# Patient Record
Sex: Female | Born: 1960 | Race: White | Hispanic: No | Marital: Single | Smoking: Never smoker
Health system: Southern US, Community
[De-identification: ages and names within clinical notes are randomized; demographics above are authoritative.]

## PROBLEM LIST (undated history)

## (undated) DIAGNOSIS — H469 Unspecified optic neuritis: Secondary | ICD-10-CM

---

## 2015-06-06 ENCOUNTER — Encounter (HOSPITAL_BASED_OUTPATIENT_CLINIC_OR_DEPARTMENT_OTHER): Payer: Self-pay | Admitting: Emergency Medicine

## 2015-06-06 ENCOUNTER — Emergency Department (HOSPITAL_BASED_OUTPATIENT_CLINIC_OR_DEPARTMENT_OTHER): Payer: BLUE CROSS/BLUE SHIELD

## 2015-06-06 ENCOUNTER — Emergency Department (HOSPITAL_BASED_OUTPATIENT_CLINIC_OR_DEPARTMENT_OTHER)
Admission: EM | Admit: 2015-06-06 | Discharge: 2015-06-06 | Disposition: A | Discharge status: Home / Self Care | Payer: BLUE CROSS/BLUE SHIELD | Attending: Emergency Medicine | Admitting: Emergency Medicine

## 2015-06-06 DIAGNOSIS — Y9289 Other specified places as the place of occurrence of the external cause: Secondary | ICD-10-CM | POA: Insufficient documentation

## 2015-06-06 DIAGNOSIS — W450XXA Nail entering through skin, initial encounter: Secondary | ICD-10-CM | POA: Diagnosis not present

## 2015-06-06 DIAGNOSIS — S99922A Unspecified injury of left foot, initial encounter: Secondary | ICD-10-CM | POA: Diagnosis present

## 2015-06-06 DIAGNOSIS — Y9389 Activity, other specified: Secondary | ICD-10-CM | POA: Insufficient documentation

## 2015-06-06 DIAGNOSIS — Z23 Encounter for immunization: Secondary | ICD-10-CM | POA: Insufficient documentation

## 2015-06-06 DIAGNOSIS — Y998 Other external cause status: Secondary | ICD-10-CM | POA: Insufficient documentation

## 2015-06-06 DIAGNOSIS — S91332A Puncture wound without foreign body, left foot, initial encounter: Secondary | ICD-10-CM

## 2015-06-06 HISTORY — DX: Unspecified optic neuritis: H46.9

## 2015-06-06 MED ORDER — CIPROFLOXACIN HCL 500 MG PO TABS: 500.0000 mg | ORAL_TABLET | Freq: Two times a day (BID) | ORAL | Status: AC

## 2015-06-06 MED ORDER — TETANUS-DIPHTH-ACELL PERTUSSIS 5-2.5-18.5 LF-MCG/0.5 IM SUSP
0.5000 mL | Freq: Once | INTRAMUSCULAR | Status: AC
  Administered 2015-06-06: 0.5 mL via INTRAMUSCULAR
  Filled 2015-06-06: qty 0.5

## 2015-06-06 MED ORDER — HYDROCODONE-ACETAMINOPHEN 5-325 MG PO TABS: 1.0000 | ORAL_TABLET | Freq: Four times a day (QID) | ORAL | Status: AC | PRN

## 2015-06-06 MED ORDER — IBUPROFEN 800 MG PO TABS
800.0000 mg | ORAL_TABLET | Freq: Once | ORAL | Status: AC
  Administered 2015-06-06: 800 mg via ORAL
  Filled 2015-06-06: qty 1

## 2015-06-06 MED ORDER — CIPROFLOXACIN HCL 500 MG PO TABS
500.0000 mg | ORAL_TABLET | Freq: Once | ORAL | Status: AC
  Administered 2015-06-06: 500 mg via ORAL
  Filled 2015-06-06: qty 1

## 2015-06-06 NOTE — ED Notes (Signed)
Pt stepped on nail with right foot and nail went threw her flip flop into her foot

## 2015-06-06 NOTE — Discharge Instructions (Signed)
Puncture Wound °A puncture wound is an injury that extends through all layers of the skin and into the tissue beneath the skin (subcutaneous tissue). Puncture wounds become infected easily because germs often enter the body and go beneath the skin during the injury. Having a deep wound with a small entrance point makes it difficult for your caregiver to adequately clean the wound. This is especially true if you have stepped on a nail and it has passed through a dirty shoe or other situations where the wound is obviously contaminated. °CAUSES  °Many puncture wounds involve glass, nails, splinters, fish hooks, or other objects that enter the skin (foreign bodies). A puncture wound may also be caused by a human bite or animal bite. °DIAGNOSIS  °A puncture wound is usually diagnosed by your history and a physical exam. You may need to have an X-ray or an ultrasound to check for any foreign bodies still in the wound. °TREATMENT  °· Your caregiver will clean the wound as thoroughly as possible. Depending on the location of the wound, a bandage (dressing) may be applied. °· Your caregiver might prescribe antibiotic medicines. °· You may need a follow-up visit to check on your wound. Follow all instructions as directed by your caregiver. °HOME CARE INSTRUCTIONS  °· Change your dressing once per day, or as directed by your caregiver. If the dressing sticks, it may be removed by soaking the area in water. °· If your caregiver has given you follow-up instructions, it is very important that you return for a follow-up appointment. Not following up as directed could result in a chronic or permanent injury, pain, and disability. °· Only take over-the-counter or prescription medicines for pain, discomfort, or fever as directed by your caregiver. °· If you are given antibiotics, take them as directed. Finish them even if you start to feel better. °You may need a tetanus shot if: °· You cannot remember when you had your last tetanus  shot. °· You have never had a tetanus shot. °If you got a tetanus shot, your arm may swell, get red, and feel warm to the touch. This is common and not a problem. If you need a tetanus shot and you choose not to have one, there is a rare chance of getting tetanus. Sickness from tetanus can be serious. °You may need a rabies shot if an animal bite caused your puncture wound. °SEEK MEDICAL CARE IF:  °· You have redness, swelling, or increasing pain in the wound. °· You have red streaks going away from the wound. °· You notice a bad smell coming from the wound or dressing. °· You have yellowish-white fluid (pus) coming from the wound. °· You are treated with an antibiotic for infection, but the infection is not getting better. °· You notice something in the wound, such as rubber from your shoe, cloth, or another object. °· You have a fever. °· You have severe pain. °· You have difficulty breathing. °· You feel dizzy or faint. °· You cannot stop vomiting. °· You lose feeling, develop numbness, or cannot move a limb below the wound. °· Your symptoms worsen. °MAKE SURE YOU: °· Understand these instructions. °· Will watch your condition. °· Will get help right away if you are not doing well or get worse. °Document Released: 06/14/2005 Document Revised: 11/27/2011 Document Reviewed: 02/21/2011 °[employer]® Patient Information ©2015 [employer]. This information is not intended to replace advice given to you by your health care provider. Make sure you discuss any questions you   have with your health care provider. ° °

## 2015-06-06 NOTE — ED Provider Notes (Signed)
CSN: [id number]     Arrival date & time 06/06/15  1849 History  This chart was scribed for Elwin Mocha, MD by Octavia Heir, ED Scribe. This patient was seen in room MH01/MH01 and the patient's care was started at 7:02 PM.    Chief Complaint  Patient presents with  . Laceration     Patient is a 54 y.o. female presenting with foot injury. The history is provided by the patient. No language interpreter was used.  Foot Injury Location:  Foot Time since incident:  1 hour Injury: yes   Mechanism of injury comment:  Stepped on a nail Foot location:  Sole of L foot Pain details:    Quality:  Aching   Radiates to:  Does not radiate   Severity:  Moderate   Onset quality:  Gradual   Duration:  1 hour   Timing:  Constant   Progression:  Unchanged Chronicity:  New Dislocation: no   Foreign body present:  No foreign bodies Tetanus status:  Out of date Prior injury to area:  No Relieved by:  Nothing Worsened by:  Nothing tried Associated symptoms: no fever    HPI Comments: Martha Maldonado is a 54 y.o. female who presents to the Emergency Department complaining of a sudden onset left foot injury onset PTA. Pt notes she stepped on a nail that went through her flip flop this afternoon. Pt is unsure of her last tetanus shot.  No past medical history on file. No past surgical history on file. No family history on file. Social History  Substance Use Topics  . Smoking status: Not on file  . Smokeless tobacco: Not on file  . Alcohol Use: Not on file   OB History    No data available     Review of Systems  Constitutional: Negative for fever.  Respiratory: Negative for cough and shortness of breath.   Gastrointestinal: Negative for vomiting and abdominal pain.  All other systems reviewed and are negative.     Allergies  Review of patient's allergies indicates not on file.  Home Medications   Prior to Admission medications   Not on File   Triage vitals: BP 131/86 mmHg  Pulse  86  Temp(Src) 98.1 F (36.7 C) (Oral)  Resp 18  Ht  (1.6 m)  Wt 170 lb (77.111 kg)  BMI 30.12 kg/m2  SpO2 100% Physical Exam  Constitutional: She is oriented to person, place, and time. She appears well-developed and well-nourished. No distress.  HENT:  Head: Normocephalic and atraumatic.  Mouth/Throat: Oropharynx is clear and moist.  Eyes: EOM are normal. Pupils are equal, round, and reactive to light.  Neck: Normal range of motion. Neck supple.  Cardiovascular: Normal rate and regular rhythm.  Exam reveals no friction rub.   No murmur heard. Pulmonary/Chest: Effort normal and breath sounds normal. No respiratory distress. She has no wheezes. She has no rales.  Abdominal: Soft. She exhibits no distension. There is no tenderness. There is no rebound.  Musculoskeletal: Normal range of motion. She exhibits no edema.       Feet:  Neurological: She is alert and oriented to person, place, and time.  Skin: She is not diaphoretic.  Nursing note and vitals reviewed.   ED Course  Procedures  DIAGNOSTIC STUDIES: Oxygen Saturation is 100% on RA, normal by my interpretation.  COORDINATION OF CARE:  7:03 PM Discussed treatment plan which includes x-ray of left foot, antibiotic with pt at bedside and pt agreed to plan.  Labs Review Labs Reviewed - No data to display  Imaging Review Dg Foot Complete Left  06/06/2015   CLINICAL DATA:  Stepped on a nail. Puncture wound near the base of the fifth metatarsal. Initial encounter.  EXAM: LEFT FOOT - COMPLETE 3+ VIEW  COMPARISON:  None.  FINDINGS: The mineralization and alignment are normal. There is no evidence of acute fracture or dislocation. No foreign body or soft tissue emphysema demonstrated. The joint spaces are maintained. There is a small plantar calcaneal spur.  IMPRESSION: No acute osseous findings or evidence of foreign body.   Electronically Signed   By: Carey Bullocks M.D.   On: 06/06/2015 19:23   I have personally reviewed  and evaluated these images and lab results as part of my medical decision-making.   EKG Interpretation None      MDM   Final diagnoses:  Nail wound of foot, left, initial encounter    54 year old female here with a wound on the sole of her left foot. She stepped on a nail that went through her flip flop. Here no foreign bodies noted and x-rays negative. Tetanus updated and placed on Cipro since nail with her flip flop. Wound is irrigated out here. Stable for discharge.   I personally performed the services described in this documentation, which was scribed in my presence. The recorded information has been reviewed and is accurate.    Elwin Mocha, MD 06/06/15 2135

## 2016-09-30 IMAGING — CR DG FOOT COMPLETE 3+V*L*
3 series · 3 of 3 positions shown · non-contrast
Comparison: None.

CLINICAL DATA: Stepped on a nail. Puncture wound near the base of
the fifth metatarsal. Initial encounter.

EXAM:
LEFT FOOT - COMPLETE 3+ VIEW

[t foot ap left]
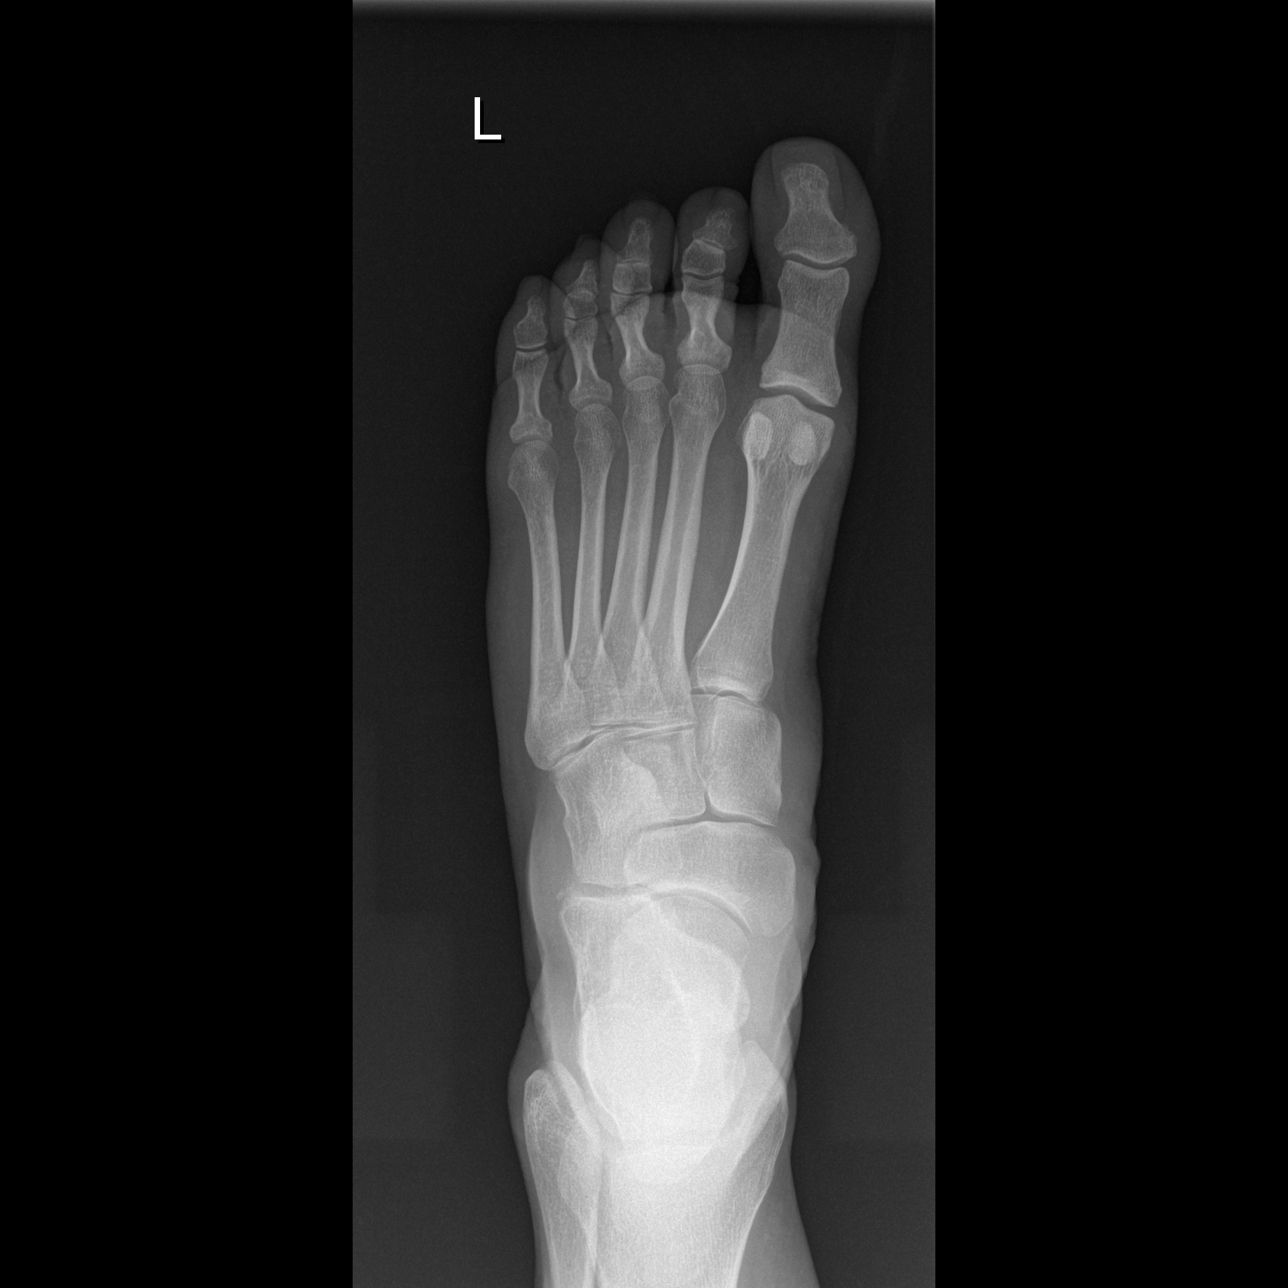

[t foot oblique left]
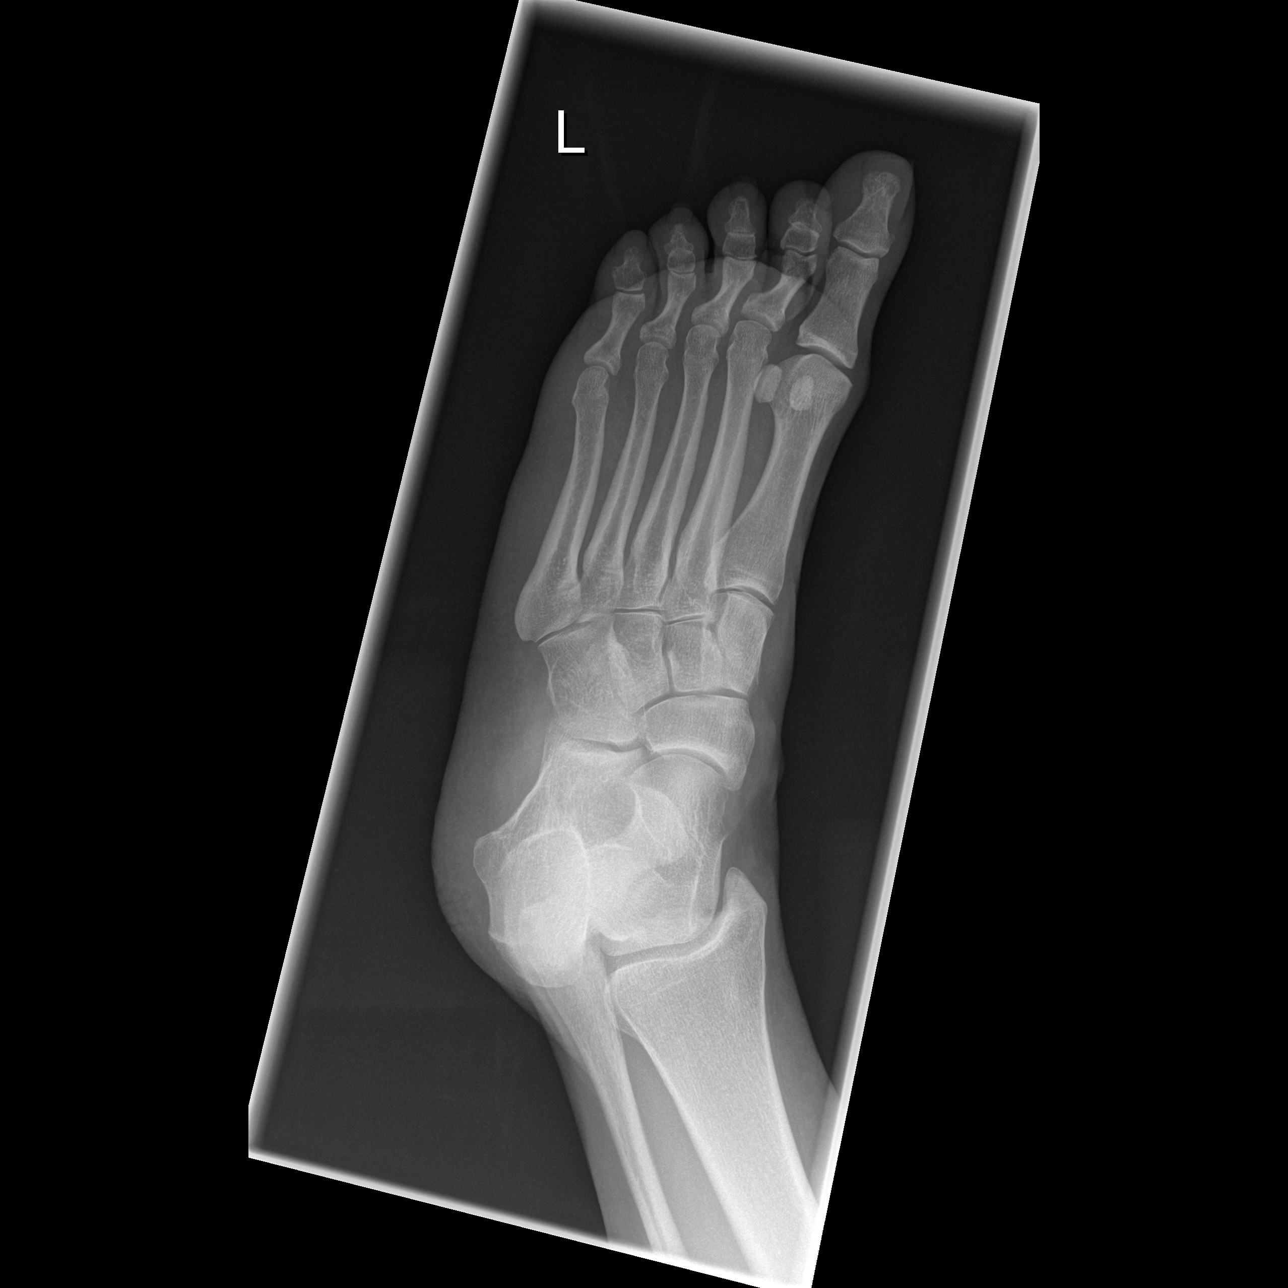

[t foot lat left]
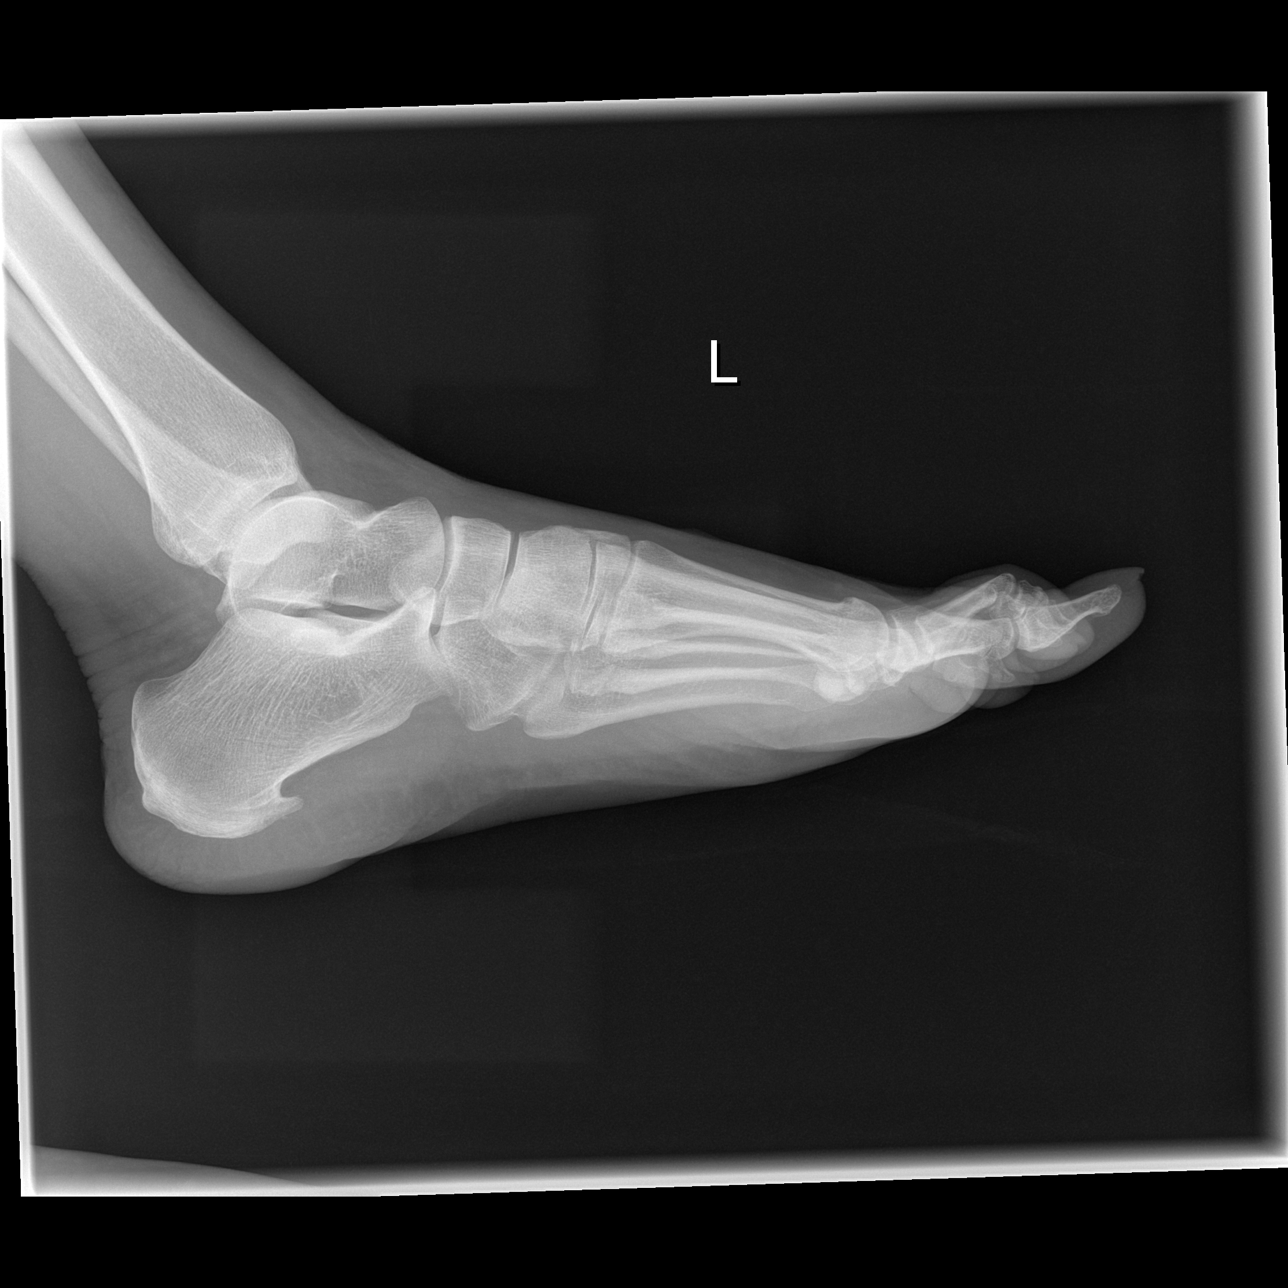

[3 of 3 positions shown; findings below may reference images not displayed]

FINDINGS: The mineralization and alignment are normal. There is no evidence of
acute fracture or dislocation. No foreign body or soft tissue
emphysema demonstrated. The joint spaces are maintained. There is a
small plantar calcaneal spur.
IMPRESSION: No acute osseous findings or evidence of foreign body.
